# Patient Record
Sex: Male | Born: 1975 | Race: White | Hispanic: No | Marital: Single | Smoking: Current every day smoker
Health system: Southern US, Community
[De-identification: ages and names within clinical notes are randomized; demographics above are authoritative.]

---

## 2008-01-22 ENCOUNTER — Inpatient Hospital Stay (HOSPITAL_COMMUNITY): Admission: EM | Admit: 2008-01-22 | Discharge: 2008-01-24 | Payer: Self-pay | Admitting: Emergency Medicine

## 2008-02-11 ENCOUNTER — Encounter: Admission: RE | Admit: 2008-02-11 | Discharge: 2008-02-11 | Payer: Self-pay | Admitting: Neurosurgery

## 2008-03-02 ENCOUNTER — Encounter: Admission: RE | Admit: 2008-03-02 | Discharge: 2008-03-02 | Payer: Self-pay | Admitting: Neurosurgery

## 2008-04-04 ENCOUNTER — Encounter: Admission: RE | Admit: 2008-04-04 | Discharge: 2008-04-04 | Payer: Self-pay | Admitting: Neurosurgery

## 2008-06-08 ENCOUNTER — Encounter: Admission: RE | Admit: 2008-06-08 | Discharge: 2008-06-08 | Payer: Self-pay | Admitting: Neurosurgery

## 2008-08-24 IMAGING — CR DG THORACOLUMBAR SPINE 2V
2 series · 2 of 2 positions shown · non-contrast
Comparison: [REDACTED] thoracoabdominal CT, 01/22/08.

CLINICAL DATA: Follow-up T12 fracture post fall injury 01/22/08.
 DIAGNOSTIC THORACOLUMBAR SPINE - 2 VIEW:

[t t-spine/l-spine junc. ap]
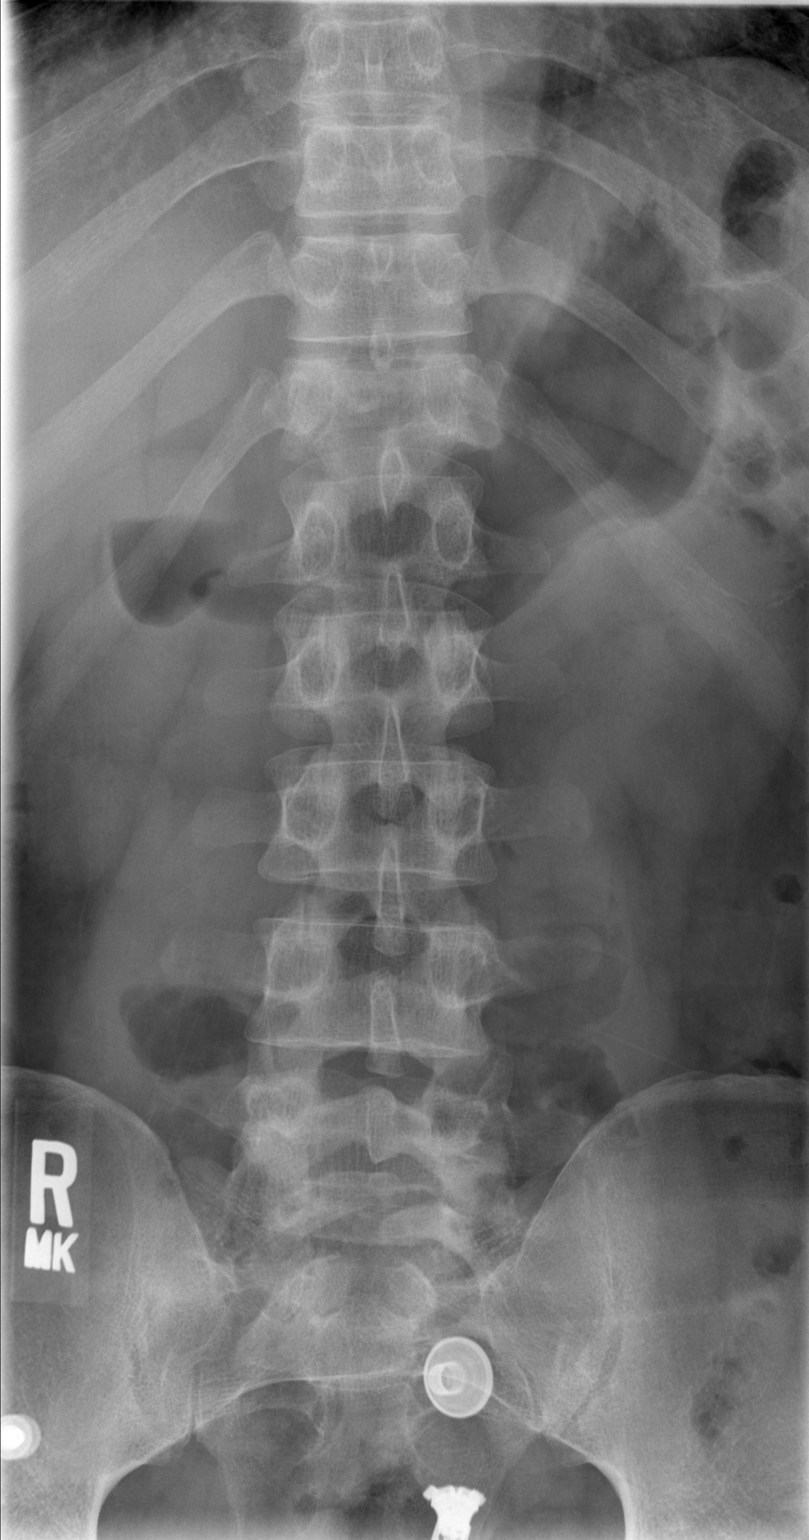

[t t-spine/l-spine junc lat]
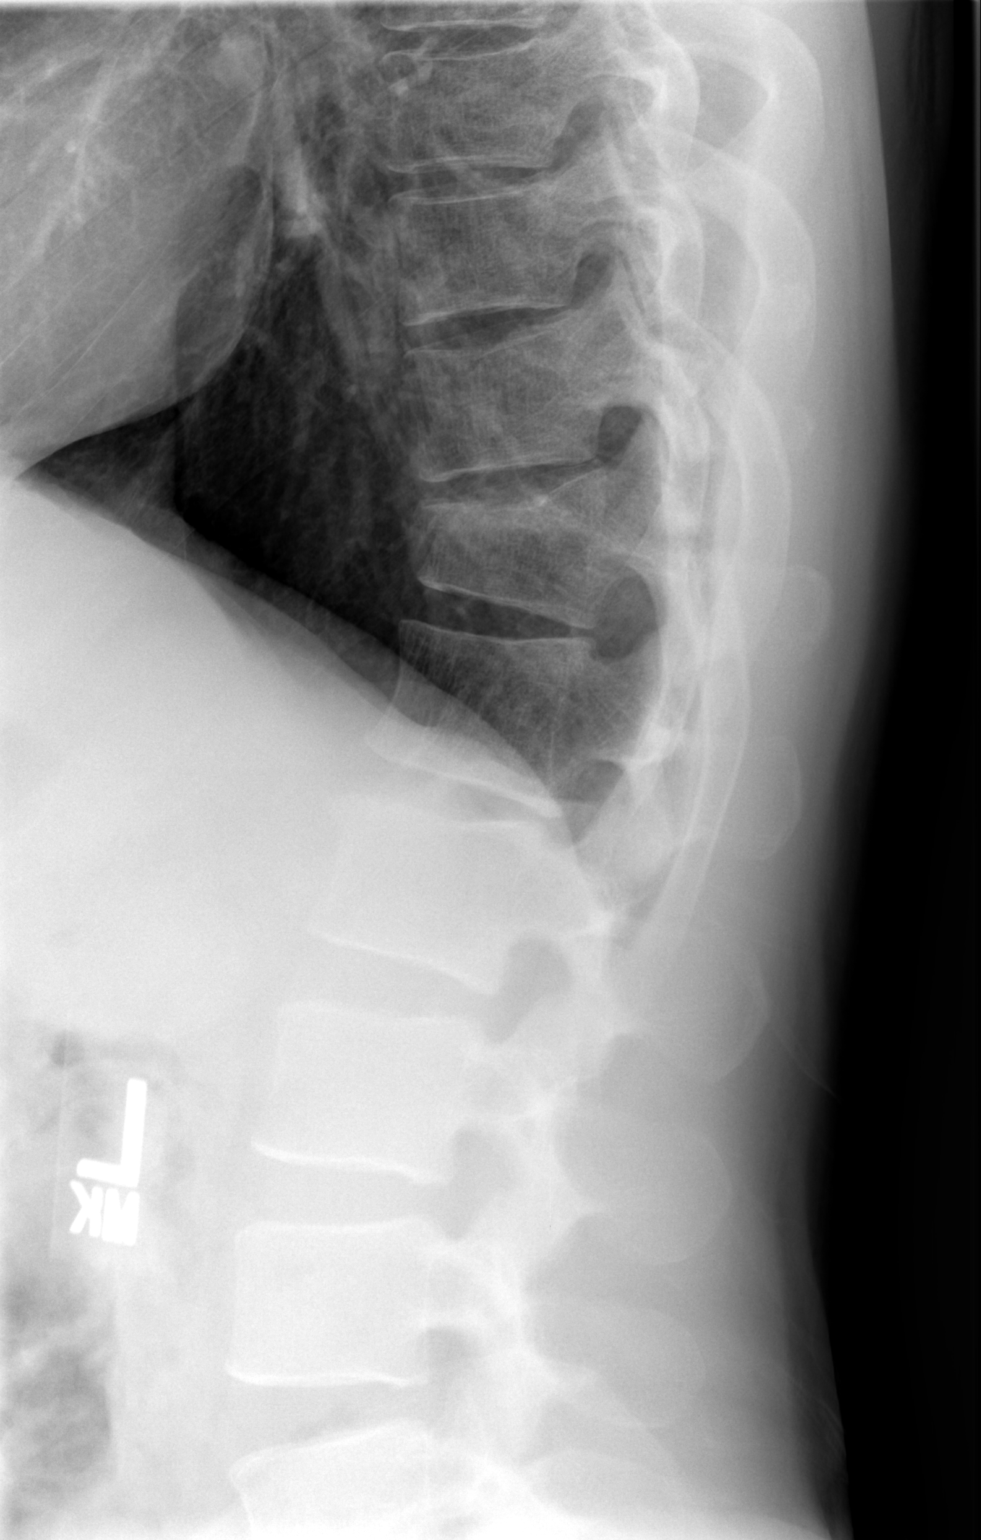

[2 of 2 positions shown; findings below may reference images not displayed]

FINDINGS: Stable slight superior endplate compression fracture with sclerosis consistent with healing is seen at T12 without retropulsion.  No other significant abnormality is seen with disk spaces and posterior vertebral alignment normally maintained (incidental S1 spina bifida occult findings noted).
IMPRESSION: 1.  Healing slight superior T12 compression fracture deformity without retropulsion. 
 2.  Otherwise negative.

## 2011-08-16 LAB — DIFFERENTIAL
Basophils Absolute: 0
Basophils Relative: 0
Eosinophils Absolute: 0.1
Monocytes Absolute: 0.5
Monocytes Relative: 7
Neutrophils Relative %: 76

## 2011-08-16 LAB — CBC
Hemoglobin: 15.3
MCHC: 35
MCV: 97.4
RBC: 4.49
RDW: 12.6

## 2011-08-16 LAB — I-STAT 8, (EC8 V) (CONVERTED LAB)
BUN: 7
Chloride: 106
pCO2, Ven: 42.4 — ABNORMAL LOW
pH, Ven: 7.324 — ABNORMAL HIGH

## 2013-04-12 ENCOUNTER — Ambulatory Visit (INDEPENDENT_AMBULATORY_CARE_PROVIDER_SITE_OTHER): Payer: 59 | Admitting: Licensed Clinical Social Worker

## 2013-04-12 DIAGNOSIS — F1010 Alcohol abuse, uncomplicated: Secondary | ICD-10-CM

## 2013-04-14 ENCOUNTER — Ambulatory Visit: Payer: Self-pay | Admitting: Psychiatry

## 2013-04-15 ENCOUNTER — Ambulatory Visit (INDEPENDENT_AMBULATORY_CARE_PROVIDER_SITE_OTHER): Payer: Self-pay | Admitting: Psychiatry

## 2013-04-15 DIAGNOSIS — F1010 Alcohol abuse, uncomplicated: Secondary | ICD-10-CM

## 2013-04-28 ENCOUNTER — Ambulatory Visit (INDEPENDENT_AMBULATORY_CARE_PROVIDER_SITE_OTHER): Payer: 59 | Admitting: Psychiatry

## 2013-04-28 DIAGNOSIS — F17200 Nicotine dependence, unspecified, uncomplicated: Secondary | ICD-10-CM

## 2013-05-04 ENCOUNTER — Ambulatory Visit (INDEPENDENT_AMBULATORY_CARE_PROVIDER_SITE_OTHER): Payer: 59 | Admitting: Psychiatry

## 2013-05-04 DIAGNOSIS — F1010 Alcohol abuse, uncomplicated: Secondary | ICD-10-CM

## 2013-05-12 ENCOUNTER — Ambulatory Visit (INDEPENDENT_AMBULATORY_CARE_PROVIDER_SITE_OTHER): Payer: 59 | Admitting: Psychiatry

## 2013-05-12 DIAGNOSIS — F1010 Alcohol abuse, uncomplicated: Secondary | ICD-10-CM

## 2013-05-12 DIAGNOSIS — Z7189 Other specified counseling: Secondary | ICD-10-CM

## 2013-05-26 ENCOUNTER — Ambulatory Visit (INDEPENDENT_AMBULATORY_CARE_PROVIDER_SITE_OTHER): Payer: 59 | Admitting: Psychiatry

## 2013-05-26 DIAGNOSIS — F1010 Alcohol abuse, uncomplicated: Secondary | ICD-10-CM

## 2013-05-26 DIAGNOSIS — Z7189 Other specified counseling: Secondary | ICD-10-CM

## 2013-06-09 ENCOUNTER — Ambulatory Visit: Payer: 59 | Admitting: Psychiatry

## 2013-06-10 ENCOUNTER — Ambulatory Visit (INDEPENDENT_AMBULATORY_CARE_PROVIDER_SITE_OTHER): Payer: 59 | Admitting: Psychiatry

## 2013-06-10 DIAGNOSIS — F1010 Alcohol abuse, uncomplicated: Secondary | ICD-10-CM

## 2013-06-24 ENCOUNTER — Ambulatory Visit: Payer: 59 | Admitting: Psychiatry

## 2022-01-30 ENCOUNTER — Other Ambulatory Visit: Payer: Self-pay

## 2022-01-30 ENCOUNTER — Ambulatory Visit: Payer: Self-pay | Admitting: Physician Assistant

## 2022-01-30 VITALS — BP 144/96 | HR 104 | Resp 18 | Ht 69.0 in | Wt 205.0 lb

## 2022-01-30 DIAGNOSIS — R030 Elevated blood-pressure reading, without diagnosis of hypertension: Secondary | ICD-10-CM | POA: Insufficient documentation

## 2022-01-30 DIAGNOSIS — L309 Dermatitis, unspecified: Secondary | ICD-10-CM | POA: Insufficient documentation

## 2022-01-30 MED ORDER — METHYLPREDNISOLONE ACETATE 40 MG/ML IJ SUSP
80.0000 mg | Freq: Once | INTRAMUSCULAR | Status: AC
  Administered 2022-01-30: 80 mg via INTRAMUSCULAR

## 2022-01-30 MED ORDER — TRIAMCINOLONE ACETONIDE 0.5 % EX OINT: 1.0000 "application " | TOPICAL_OINTMENT | Freq: Two times a day (BID) | CUTANEOUS | 1 refills | Status: DC

## 2022-01-30 MED ORDER — HYDROXYZINE PAMOATE 25 MG PO CAPS: 25.0000 mg | ORAL_CAPSULE | Freq: Three times a day (TID) | ORAL | 0 refills | Status: DC | PRN

## 2022-01-30 NOTE — Progress Notes (Signed)
Pt has eaten today. Reports rash and discomfort/pain. ?

## 2022-01-30 NOTE — Patient Instructions (Signed)
You are going to use Kenalog on affected areas twice daily until resolved.  You can use the hydroxyzine 1/2 tablet to 1 full tablet every 8 hours as needed to help you with the itching.  Your blood pressure is elevated today, I do encourage you to check your blood pressure at home, keep a written log and have it available for when you follow-up with the mobile unit in 1 week.  Please return in 1 week, please plan on having fasting labs completed.  I hope that you feel better soon!  Thomas Rad, PA-C Physician Assistant [facility] [email or web address]  Rash, Adult A rash is a change in the color of your skin. A rash can also change the way your skin feels. There are many different conditions and factors that can cause a rash. Some rashes may disappear after a few days, but some may last for a few weeks. Common causes of rashes include: Viral infections, such as: Colds. Measles. Hand, foot, and mouth disease. Bacterial infections, such as: Scarlet fever. Impetigo. Fungal infections, such as Candida. Allergic reactions to food, medicines, or skin care products. Follow these instructions at home: The goal of treatment is to stop the itching and keep the rash from spreading. Pay attention to any changes in your symptoms. Follow these instructions to help with your condition: Medicine Take or apply over-the-counter and prescription medicines only as told by your health care provider. These may include: Corticosteroid creams to treat red or swollen skin. Anti-itch lotions. Oral allergy medicines (antihistamines). Oral corticosteroids for severe symptoms.  Skin care Apply cool compresses to the affected areas. Do not scratch or rub your skin. Avoid covering the rash. Make sure the rash is exposed to air as much as possible. Managing itching and discomfort Avoid hot showers or baths, which can make itching worse. A cold shower may  help. Try taking a bath with: Epsom salts. Follow manufacturer instructions on the packaging. You can get these at your local pharmacy or grocery store. Baking soda. Pour a small amount into the bath as told by your health care provider. Colloidal oatmeal. Follow manufacturer instructions on the packaging. You can get this at your local pharmacy or grocery store. Try applying baking soda paste to your skin. Stir water into baking soda until it reaches a paste-like consistency. Try applying calamine lotion. This is an over-the-counter lotion that helps to relieve itchiness. Keep cool and out of the sun. Sweating and being hot can make itching worse. General instructions  Rest as needed. Drink enough fluid to keep your urine pale yellow. Wear loose-fitting clothing. Avoid scented soaps, detergents, and perfumes. Use gentle soaps, detergents, perfumes, and other cosmetic products. Avoid any substance that causes your rash. Keep a journal to help track what causes your rash. Write down: What you eat. What cosmetic products you use. What you drink. What you wear. This includes jewelry. Keep all follow-up visits as told by your health care provider. This is important. Contact a health care provider if: You sweat at night. You lose weight. You urinate more than normal. You urinate less than normal, or you notice that your urine is a darker color than usual. You feel weak. You vomit. Your skin or the whites of your eyes look yellow (jaundice). Your skin: Tingles. Is numb. Your rash: Does not go away after several days. Gets worse. You are: Unusually thirsty. More tired than normal. You have: New symptoms. Pain in your abdomen. A fever. Diarrhea. Get help  right away if you: Have a fever and your symptoms suddenly get worse. Develop confusion. Have a severe headache or a stiff neck. Have severe joint pains or stiffness. Have a seizure. Develop a rash that covers all or most of  your body. The rash may or may not be painful. Develop blisters that: Are on top of the rash. Grow larger or grow together. Are painful. Are inside your nose or mouth. Develop a rash that: Looks like purple pinprick-sized spots all over your body. Has a "bull's eye" or looks like a target. Is not related to sun exposure, is red and painful, and causes your skin to peel. Summary A rash is a change in the color of your skin. Some rashes disappear after a few days, but some may last for a few weeks. The goal of treatment is to stop the itching and keep the rash from spreading. Take or apply over-the-counter and prescription medicines only as told by your health care provider. Contact a health care provider if you have new or worsening symptoms. Keep all follow-up visits as told by your health care provider. This is important. This information is not intended to replace advice given to you by your health care provider. Make sure you discuss any questions you have with your health care provider. Document Revised: 03/05/2019 Document Reviewed: 06/15/2018 Elsevier Patient Education  2022 [employer].  How to Take Your Blood Pressure Blood pressure is a measurement of how strongly your blood is pressing against the walls of your arteries. Arteries are blood vessels that carry blood from your heart throughout your body. Your health care provider takes your blood pressure at each office visit. You can also take your own blood pressure at home with a blood pressure monitor. You may need to take your own blood pressure to: Confirm a diagnosis of high blood pressure (hypertension). Monitor your blood pressure over time. Make sure your blood pressure medicine is working. Supplies needed: Blood pressure monitor. Dining room chair to sit in. Table or desk. Small notebook and pencil or pen. How to prepare To get the most accurate reading, avoid the following for 30 minutes before you check your  blood pressure: Drinking caffeine. Drinking alcohol. Eating. Smoking. Exercising. Five minutes before you check your blood pressure: Use the bathroom and urinate so that you have an empty bladder. Sit quietly in a dining room chair. Do not sit in a soft couch or an armchair. Do not talk. How to take your blood pressure To check your blood pressure, follow the instructions in the manual that came with your blood pressure monitor. If you have a digital blood pressure monitor, the instructions may be as follows: Sit up straight in a chair. Place your feet on the floor. Do not cross your ankles or legs. Rest your left arm at the level of your heart on a table or desk or on the arm of a chair. Pull up your shirt sleeve. Wrap the blood pressure cuff around the upper part of your left arm, 1 inch (2.5 cm) above your elbow. It is best to wrap the cuff around bare skin. Fit the cuff snugly around your arm. You should be able to place only one finger between the cuff and your arm. Position the cord so that it rests in the bend of your elbow. Press the power button. Sit quietly while the cuff inflates and deflates. Read the digital reading on the monitor screen and write the numbers down (record them) in  a notebook. Wait 2-3 minutes, then repeat the steps, starting at step 1. What does my blood pressure reading mean? A blood pressure reading consists of a higher number over a lower number. Ideally, your blood pressure should be below 120/80. The first ("top") number is called the systolic pressure. It is a measure of the pressure in your arteries as your heart beats. The second ("bottom") number is called the diastolic pressure. It is a measure of the pressure in your arteries as the heart relaxes. Blood pressure is classified into five stages. The following are the stages for adults who do not have a short-term serious illness or a chronic condition. Systolic pressure and diastolic pressure are  measured in a unit called mm Hg (millimeters of mercury).  Normal Systolic pressure: below 123456. Diastolic pressure: below 80. Elevated Systolic pressure: [id number]. Diastolic pressure: below 80. Hypertension stage 1 Systolic pressure: 0000000. Diastolic pressure: [id number]. Hypertension stage 2 Systolic pressure: [id number] or above. Diastolic pressure: 90 or above. You can have elevated blood pressure or hypertension even if only the systolic or only the diastolic number in your reading is higher than normal. Follow these instructions at home: Medicines Take over-the-counter and prescription medicines only as told by your health care provider. Tell your health care provider if you are having any side effects from blood pressure medicine. General instructions Check your blood pressure as often as recommended by your health care provider. Check your blood pressure at the same time every day. Take your monitor to the next appointment with your health care provider to make sure that: You are using it correctly. It provides accurate readings. Understand what your goal blood pressure numbers are. Keep all follow-up visits as told by your health care provider. This is important. General tips Your health care provider can suggest a reliable monitor that will meet your needs. There are several types of home blood pressure monitors. Choose a monitor that has an arm cuff. Do not choose a monitor that measures your blood pressure from your wrist or finger. Choose a cuff that wraps snugly around your upper arm. You should be able to fit only one finger between your arm and the cuff. You can buy a blood pressure monitor at most drugstores or online. Where to find more information American Heart Association: www.heart.org Contact a health care provider if: Your blood pressure is consistently high. Your blood pressure is suddenly low. Get help right away if: Your systolic blood pressure is higher than  180. Your diastolic blood pressure is higher than 120. Summary Blood pressure is a measurement of how strongly your blood is pressing against the walls of your arteries. A blood pressure reading consists of a higher number over a lower number. Ideally, your blood pressure should be below 120/80. Check your blood pressure at the same time every day. Avoid caffeine, alcohol, smoking, and exercise for 30 minutes prior to checking your blood pressure. These agents can affect the accuracy of the blood pressure reading. This information is not intended to replace advice given to you by your health care provider. Make sure you discuss any questions you have with your health care provider. Document Revised: 09/20/2020 Document Reviewed: 11/05/2019 Elsevier Patient Education  2022 [employer].

## 2022-01-30 NOTE — Progress Notes (Signed)
? ?New Patient Office Visit ? ?Subjective:  ?Patient ID: Thomas Whitehead, male    DOB: Apr 24, 1976  Age: 46 y.o. MRN: [id number] ? ?CC:  ?Chief Complaint  ?Patient presents with  ? Rash  ? ? ?HPI ?Thomas Whitehead states that he started having a rash in both of his armpits approximately 2 weeks ago, states since then it has spread down both arms, all to his neck, on his trunk and his back as well as his groin area.  Denies purulence, states that it is itchy, worse at night.  Denies any new detergents, lotions, medications, body washes, soaps.  No one else in the house with similar rash.  States that he has tried hydrocortisone over-the-counter without relief. ? ?States that he does not check his blood pressure at home, states the last time he was seen in a medical office was approximately 5 years ago, states at that time he was told he was "borderline hypertensive".  Denies any hypertensive symptoms. ? ?History reviewed. No pertinent past medical history. ? ?History reviewed. No pertinent surgical history. ? ?History reviewed. No pertinent family history. ? ?Social History  ? ?Socioeconomic History  ? Marital status: Single  ?  Spouse name: Not on file  ? Number of children: Not on file  ? Years of education: Not on file  ? Highest education level: Not on file  ?Occupational History  ? Not on file  ?Tobacco Use  ? Smoking status: Every Day  ?  Types: E-cigarettes  ? Smokeless tobacco: Not on file  ?Substance and Sexual Activity  ? Alcohol use: Not Currently  ? Drug use: Never  ? Sexual activity: Not on file  ?Other Topics Concern  ? Not on file  ?Social History Narrative  ? Not on file  ? ?Social Determinants of Health  ? ?Financial Resource Strain: Not on file  ?Food Insecurity: Not on file  ?Transportation Needs: Not on file  ?Physical Activity: Not on file  ?Stress: Not on file  ?Social Connections: Not on file  ?Intimate Partner Violence: Not on file  ? ? ?ROS ?Review of Systems  ?Constitutional:  Negative for  chills and fever.  ?HENT: Negative.    ?Eyes: Negative.   ?Respiratory:  Negative for shortness of breath.   ?Cardiovascular:  Negative for chest pain.  ?Gastrointestinal: Negative.   ?Endocrine: Negative.   ?Genitourinary: Negative.   ?Musculoskeletal: Negative.   ?Skin:  Positive for rash.  ?Allergic/Immunologic: Negative.   ?Neurological: Negative.   ?Hematological: Negative.   ?Psychiatric/Behavioral: Negative.    ? ?Objective:  ? ?Today's Vitals: BP (!) 144/96 (BP Location: Left Arm, Patient Position: Sitting, Cuff Size: Normal)   Pulse (!) 104   Resp 18   Ht 5\' 9"  (1.753 m)   Wt 205 lb (93 kg)   SpO2 97%   BMI 30.27 kg/m?  ? ?Physical Exam ?Vitals and nursing note reviewed.  ?Constitutional:   ?   Appearance: Normal appearance.  ?HENT:  ?   Head: Normocephalic and atraumatic.  ?   Right Ear: External ear normal.  ?   Left Ear: External ear normal.  ?   Nose: Nose normal.  ?   Mouth/Throat:  ?   Mouth: Mucous membranes are moist.  ?   Pharynx: Oropharynx is clear.  ?Eyes:  ?   Extraocular Movements: Extraocular movements intact.  ?   Conjunctiva/sclera: Conjunctivae normal.  ?   Pupils: Pupils are equal, round, and reactive to light.  ?Cardiovascular:  ?  Rate and Rhythm: Normal rate and regular rhythm.  ?   Pulses: Normal pulses.  ?   Heart sounds: Normal heart sounds.  ?Pulmonary:  ?   Effort: Pulmonary effort is normal.  ?   Breath sounds: Normal breath sounds.  ?Musculoskeletal:     ?   General: Normal range of motion.  ?   Cervical back: Normal range of motion and neck supple.  ?Skin: ?   General: Skin is warm and dry.  ?   Findings: Rash present.  ?   Comments: See photos ?  ?Neurological:  ?   General: No focal deficit present.  ?   Mental Status: He is alert and oriented to person, place, and time.  ?Psychiatric:     ?   Mood and Affect: Mood normal.     ?   Behavior: Behavior normal.     ?   Thought Content: Thought content normal.     ?   Judgment: Judgment normal.   ? ? ? ? ? ? ? ? ? ? ? ?Verbal consent given by patient to have photos included in chart ? ? ?Assessment & Plan:  ? ?Problem List Items Addressed This Visit   ? ?  ? Musculoskeletal and Integument  ? Dermatitis - Primary  ? Relevant Medications  ? triamcinolone ointment (KENALOG) 0.5 %  ? hydrOXYzine (VISTARIL) 25 MG capsule  ?  ? Other  ? Elevated blood pressure reading in office without diagnosis of hypertension  ? ? ?Outpatient Encounter Medications as of 01/30/2022  ?Medication Sig  ? hydrOXYzine (VISTARIL) 25 MG capsule Take 1 capsule (25 mg total) by mouth every 8 (eight) hours as needed.  ? triamcinolone ointment (KENALOG) 0.5 % Apply 1 application. topically 2 (two) times daily.  ? [EXPIRED] methylPREDNISolone acetate (DEPO-MEDROL) injection 80 mg   ? ?No facility-administered encounter medications on file as of 01/30/2022.  ?1. Dermatitis ?Patient given injection of Depo-Medrol in office, trial Kenalog, trial hydroxyzine.  Patient education given on supportive care. ? ? ?- triamcinolone ointment (KENALOG) 0.5 %; Apply 1 application. topically 2 (two) times daily.  Dispense: 60 g; Refill: 1 ?- hydrOXYzine (VISTARIL) 25 MG capsule; Take 1 capsule (25 mg total) by mouth every 8 (eight) hours as needed.  Dispense: 30 capsule; Refill: 0 ?- methylPREDNISolone acetate (DEPO-MEDROL) injection 80 mg ? ?2. Elevated blood pressure reading in office without diagnosis of hypertension ?Patient encouraged to check blood pressure at home on a daily basis.  Patient to return to mobile unit in 1 week for blood pressure review as well as fasting labs. ? ?Patient given application for [facility] financial assistance, patient [facility].  Red flags given for prompt reevaluation ? ? ?I have reviewed the patient's medical history (PMH, PSH, Social History, Family History, Medications, and allergies) , and have been updated if relevant. I spent 30  minutes reviewing chart and  face to face time with patient. ? ? ? ? ?Follow-up: Return in about 1 week (around 02/06/2022).  ? ?[name], PA-C ? ?

## 2022-02-07 ENCOUNTER — Encounter: Payer: Self-pay | Admitting: Physician Assistant

## 2022-02-07 ENCOUNTER — Other Ambulatory Visit: Payer: Self-pay

## 2022-02-07 ENCOUNTER — Ambulatory Visit (INDEPENDENT_AMBULATORY_CARE_PROVIDER_SITE_OTHER): Payer: Self-pay | Admitting: Physician Assistant

## 2022-02-07 DIAGNOSIS — L309 Dermatitis, unspecified: Secondary | ICD-10-CM

## 2022-02-07 DIAGNOSIS — R030 Elevated blood-pressure reading, without diagnosis of hypertension: Secondary | ICD-10-CM

## 2022-02-07 DIAGNOSIS — Z114 Encounter for screening for human immunodeficiency virus [HIV]: Secondary | ICD-10-CM

## 2022-02-07 DIAGNOSIS — Z13220 Encounter for screening for lipoid disorders: Secondary | ICD-10-CM

## 2022-02-07 DIAGNOSIS — Z1159 Encounter for screening for other viral diseases: Secondary | ICD-10-CM

## 2022-02-07 NOTE — Progress Notes (Signed)
? ?Established Patient Office Visit ? ?Subjective:  ?Patient ID: Thomas Whitehead, male    DOB: December 31, 1975  Age: 46 y.o. MRN: [id number] ? ?CC:  ?Chief Complaint  ?Patient presents with  ? Rash  ? ?Virtual Visit via Telephone Note ? ?I connected with Thomas Whitehead on 02/07/22 at  2:00 PM EDT by telephone and verified that I am speaking with the correct person using two identifiers. ? ?Location: ?Patient: Home ?Provider: Home office ?  ?I discussed the limitations, risks, security and privacy concerns of performing an evaluation and management service by telephone and the availability of in person appointments. I also discussed with the patient that there may be a patient responsible charge related to this service. The patient expressed understanding and agreed to proceed. ? ? ?History of Present Illness: ?Thomas Whitehead was seen by the mobile unit on January 30, 2022 with a rash, states that it has improved but is still present.  States that itching is improved.  States that he continues to use the steroid cream twice daily.  States that he did discontinue the deodorant and believes that may have also offered relief. ? ?Reports that he has checked his blood pressure twice since his office visit, states that both times it was within normal limits, states numbers approximately 130/80. ? ?No other concerns at this time ?  ?Observations/Objective: ?Medical history and current medications reviewed, no physical exam completed ? ? ? ?History reviewed. No pertinent past medical history. ? ?History reviewed. No pertinent surgical history. ? ?History reviewed. No pertinent family history. ? ?Social History  ? ?Socioeconomic History  ? Marital status: Single  ?  Spouse name: Not on file  ? Number of children: Not on file  ? Years of education: Not on file  ? Highest education level: Not on file  ?Occupational History  ? Not on file  ?Tobacco Use  ? Smoking status: Every Day  ?  Types: E-cigarettes  ? Smokeless tobacco: Not on  file  ?Substance and Sexual Activity  ? Alcohol use: Not Currently  ? Drug use: Never  ? Sexual activity: Not on file  ?Other Topics Concern  ? Not on file  ?Social History Narrative  ? Not on file  ? ?Social Determinants of Health  ? ?Financial Resource Strain: Not on file  ?Food Insecurity: Not on file  ?Transportation Needs: Not on file  ?Physical Activity: Not on file  ?Stress: Not on file  ?Social Connections: Not on file  ?Intimate Partner Violence: Not on file  ? ? ?Outpatient Medications Prior to Visit  ?Medication Sig Dispense Refill  ? hydrOXYzine (VISTARIL) 25 MG capsule Take 1 capsule (25 mg total) by mouth every 8 (eight) hours as needed. 30 capsule 0  ? triamcinolone ointment (KENALOG) 0.5 % Apply 1 application. topically 2 (two) times daily. 60 g 1  ? ?No facility-administered medications prior to visit.  ? ? ?Not on File ? ?ROS ?Review of Systems  ?Constitutional: Negative.   ?HENT: Negative.    ?Eyes: Negative.   ?Respiratory:  Negative for shortness of breath.   ?Cardiovascular:  Negative for chest pain.  ?Gastrointestinal: Negative.   ?Endocrine: Negative.   ?Genitourinary: Negative.   ?Musculoskeletal: Negative.   ?Skin:  Positive for rash.  ?Allergic/Immunologic: Negative.   ?Neurological: Negative.   ?Hematological: Negative.   ?Psychiatric/Behavioral: Negative.    ? ?  ?Objective:  ?  ? ? ?There were no vitals taken for this visit. ?Wt Readings from Last 3 Encounters:  ?  01/30/22 205 lb (93 kg)  ? ? ? ?Health Maintenance Due  ?Topic Date Due  ? COVID-19 Vaccine (1) Never done  ? Hepatitis C Screening  Never done  ? TETANUS/TDAP  Never done  ? INFLUENZA VACCINE  Never done  ? COLONOSCOPY (Pts 45-37yrs Insurance coverage will need to be confirmed)  Never done  ? ? ?There are no preventive care reminders to display for this patient. ? ?No results found for: TSH ?Lab Results  ?Component Value Date  ? WBC 7.6 01/22/2008  ? HGB 16.0 01/22/2008  ? HCT 47.0 01/22/2008  ? MCV 97.4 01/22/2008  ? PLT 275  01/22/2008  ? ?Lab Results  ?Component Value Date  ? NA 139 01/22/2008  ? K 3.8 01/22/2008  ? GLUCOSE 82 01/22/2008  ? BUN 7 01/22/2008  ? CREATININE 1.0 01/22/2008  ? ?No results found for: CHOL ?No results found for: HDL ?No results found for: [place] ?No results found for: TRIG ?No results found for: CHOLHDL ?No results found for: HGBA1C ? ?  ?Assessment & Plan:  ? ?Problem List Items Addressed This Visit   ? ?  ? Musculoskeletal and Integument  ? Dermatitis - Primary  ?  ? Other  ? Elevated blood pressure reading in office without diagnosis of hypertension  ? Relevant Orders  ? CBC with Differential/Platelet  ? Comp. Metabolic Panel (12)  ? ?Other Visit Diagnoses   ? ? Screening, lipid      ? Relevant Orders  ? Lipid panel  ? Screening for HIV (human immunodeficiency virus)      ? Relevant Orders  ? HIV antibody (with reflex)  ? Encounter for HCV screening test for low risk patient      ? Relevant Orders  ? HCV Ab w Reflex to Quant PCR  ? ?  ? ? ?No orders of the defined types were placed in this encounter. ? ?Assessment and Plan: ?1. Dermatitis ?Continue current regimen. ? ?2. Elevated blood pressure reading in office without diagnosis of hypertension ?Patient to present to clinic at the beginning of the week for fasting labs to be completed.  Patient encouraged to continue checking blood pressure, keeping a written log and having those available for all office visits.  Red flags given for prompt reevaluation. ?- CBC with Differential/Platelet; Future ?- Comp. Metabolic Panel (12); Future ? ?3. Screening, lipid ? ?- Lipid panel; Future ? ?4. Screening for HIV (human immunodeficiency virus) ? ?- HIV antibody (with reflex); Future ? ?5. Encounter for HCV screening test for low risk patient ? ?- HCV Ab w Reflex to Quant PCR; Future ? ? ?Follow Up Instructions: ? ?  ?I discussed the assessment and treatment plan with the patient. The patient was provided an opportunity to ask questions and all were answered. The  patient agreed with the plan and demonstrated an understanding of the instructions. ?  ?The patient was advised to call back or seek an in-person evaluation if the symptoms worsen or if the condition fails to improve as anticipated. ? ?I provided 12 minutes of non-face-to-face time during this encounter. ? ? ?Follow-up: Return in about 4 days (around 02/11/2022) for Fasting  labs.  ? ? ?Taniqua Issa S Mayers, PA-C ?

## 2022-02-07 NOTE — Patient Instructions (Signed)
I encourage you to continue using the steroid cream as prescribed.  I encourage you to continue checking your blood pressure on a daily basis, keep a written log and have that available for all office visits. ? ?Please let us know if you are going to come in on Monday or Tuesday morning to have your fasting labs completed. ? ?Roney Jaffe, PA-C ?Physician Assistant ?[facility] ?[email or web address] ? ? ?Health Maintenance, Male ?Adopting a healthy lifestyle and getting preventive care are important in promoting health and wellness. Ask your health care provider about: ?The right schedule for you to have regular tests and exams. ?Things you can do on your own to prevent diseases and keep yourself healthy. ?What should I know about diet, weight, and exercise? ?Eat a healthy diet ? ?Eat a diet that includes plenty of vegetables, fruits, low-fat dairy products, and lean protein. ?Do not eat a lot of foods that are high in solid fats, added sugars, or sodium. ?Maintain a healthy weight ?Body mass index (BMI) is a measurement that can be used to identify possible weight problems. It estimates body fat based on height and weight. Your health care provider can help determine your BMI and help you achieve or maintain a healthy weight. ?Get regular exercise ?Get regular exercise. This is one of the most important things you can do for your health. Most adults should: ?Exercise for at least 150 minutes each week. The exercise should increase your heart rate and make you sweat (moderate-intensity exercise). ?Do strengthening exercises at least twice a week. This is in addition to the moderate-intensity exercise. ?Spend less time sitting. Even light physical activity can be beneficial. ?Watch cholesterol and blood lipids ?Have your blood tested for lipids and cholesterol at 46 years of age, then have this test every 5 years. ?You may need to have your cholesterol levels  checked more often if: ?Your lipid or cholesterol levels are high. ?You are older than 46 years of age. ?You are at high risk for heart disease. ?What should I know about cancer screening? ?Many types of cancers can be detected early and may often be prevented. Depending on your health history and family history, you may need to have cancer screening at various ages. This may include screening for: ?Colorectal cancer. ?Prostate cancer. ?Skin cancer. ?Lung cancer. ?What should I know about heart disease, diabetes, and high blood pressure? ?Blood pressure and heart disease ?High blood pressure causes heart disease and increases the risk of stroke. This is more likely to develop in people who have high blood pressure readings or are overweight. ?Talk with your health care provider about your target blood pressure readings. ?Have your blood pressure checked: ?Every 3-5 years if you are 60-78 years of age. ?Every year if you are 61 years old or older. ?If you are between the ages of 56 and 72 and are a current or former smoker, ask your health care provider if you should have a one-time screening for abdominal aortic aneurysm (AAA). ?Diabetes ?Have regular diabetes screenings. This checks your fasting blood sugar level. Have the screening done: ?Once every three years after age 42 if you are at a normal weight and have a low risk for diabetes. ?More often and at a younger age if you are overweight or have a high risk for diabetes. ?What should I know about preventing infection? ?Hepatitis B ?If you have a higher risk for hepatitis B, you should be screened for this virus. Talk with your health  care provider to find out if you are at risk for hepatitis B infection. ?Hepatitis C ?Blood testing is recommended for: ?Everyone born from [birth year] through 1965. ?Anyone with known risk factors for hepatitis C. ?Sexually transmitted infections (STIs) ?You should be screened each year for STIs, including gonorrhea and chlamydia,  if: ?You are sexually active and are younger than 46 years of age. ?You are older than 46 years of age and your health care provider tells you that you are at risk for this type of infection. ?Your sexual activity has changed since you were last screened, and you are at increased risk for chlamydia or gonorrhea. Ask your health care provider if you are at risk. ?Ask your health care provider about whether you are at high risk for HIV. Your health care provider may recommend a prescription medicine to help prevent HIV infection. If you choose to take medicine to prevent HIV, you should first get tested for HIV. You should then be tested every 3 months for as long as you are taking the medicine. ?Follow these instructions at home: ?Alcohol use ?Do not drink alcohol if your health care provider tells you not to drink. ?If you drink alcohol: ?Limit how much you have to 0-2 drinks a day. ?Know how much alcohol is in your drink. In the [place] one drink equals one 12 oz bottle of beer (355 mL), one 5 oz glass of wine (148 mL), or one 1? oz glass of hard liquor (44 mL). ?Lifestyle ?Do not use any products that contain nicotine or tobacco. These products include cigarettes, chewing tobacco, and vaping devices, such as e-cigarettes. If you need help quitting, ask your health care provider. ?Do not use street drugs. ?Do not share needles. ?Ask your health care provider for help if you need support or information about quitting drugs. ?General instructions ?Schedule regular health, dental, and eye exams. ?Stay current with your vaccines. ?Tell your health care provider if: ?You often feel depressed. ?You have ever been abused or do not feel safe at home. ?Summary ?Adopting a healthy lifestyle and getting preventive care are important in promoting health and wellness. ?Follow your health care provider's instructions about healthy diet, exercising, and getting tested or screened for diseases. ?Follow your health care provider's  instructions on monitoring your cholesterol and blood pressure. ?This information is not intended to replace advice given to you by your health care provider. Make sure you discuss any questions you have with your health care provider. ?Document Revised: 04/02/2021 Document Reviewed: 04/02/2021 ?Elsevier Patient Education ? 2022 Elsevier Inc. ? ?

## 2022-02-08 ENCOUNTER — Encounter: Payer: Self-pay | Admitting: Physician Assistant

## 2022-02-15 ENCOUNTER — Ambulatory Visit: Payer: Self-pay | Attending: Physician Assistant

## 2022-02-15 ENCOUNTER — Other Ambulatory Visit: Payer: Self-pay

## 2022-02-15 DIAGNOSIS — R030 Elevated blood-pressure reading, without diagnosis of hypertension: Secondary | ICD-10-CM

## 2022-02-15 DIAGNOSIS — Z13220 Encounter for screening for lipoid disorders: Secondary | ICD-10-CM

## 2022-02-15 DIAGNOSIS — Z114 Encounter for screening for human immunodeficiency virus [HIV]: Secondary | ICD-10-CM

## 2022-02-15 DIAGNOSIS — Z1159 Encounter for screening for other viral diseases: Secondary | ICD-10-CM

## 2022-02-15 DIAGNOSIS — E782 Mixed hyperlipidemia: Secondary | ICD-10-CM

## 2022-02-16 LAB — COMP. METABOLIC PANEL (12)
AST: 24 IU/L (ref 0–40)
Albumin/Globulin Ratio: 1.6 (ref 1.2–2.2)
Albumin: 4.6 g/dL (ref 4.0–5.0)
Alkaline Phosphatase: 70 IU/L (ref 44–121)
BUN/Creatinine Ratio: 17 (ref 9–20)
BUN: 14 mg/dL (ref 6–24)
Bilirubin Total: 0.2 mg/dL (ref 0.0–1.2)
Calcium: 9.7 mg/dL (ref 8.7–10.2)
Chloride: 98 mmol/L (ref 96–106)
Creatinine, Ser: 0.81 mg/dL (ref 0.76–1.27)
Globulin, Total: 2.9 g/dL (ref 1.5–4.5)
Glucose: 98 mg/dL (ref 70–99)
Potassium: 4 mmol/L (ref 3.5–5.2)
Sodium: 136 mmol/L (ref 134–144)
Total Protein: 7.5 g/dL (ref 6.0–8.5)
eGFR: 111 mL/min/{1.73_m2} (ref >59)

## 2022-02-16 LAB — CBC WITH DIFFERENTIAL/PLATELET
Basophils Absolute: 0 10*3/uL (ref 0.0–0.2)
Basos: 1 %
EOS (ABSOLUTE): 0.2 10*3/uL (ref 0.0–0.4)
Eos: 3 %
Hematocrit: 42.2 % (ref 37.5–51.0)
Hemoglobin: 14.3 g/dL (ref 13.0–17.7)
Immature Grans (Abs): 0 10*3/uL (ref 0.0–0.1)
Immature Granulocytes: 0 %
Lymphocytes Absolute: 1.3 10*3/uL (ref 0.7–3.1)
Lymphs: 21 %
MCH: 32.9 pg (ref 26.6–33.0)
MCHC: 33.9 g/dL (ref 31.5–35.7)
MCV: 97 fL (ref 79–97)
Monocytes Absolute: 0.6 10*3/uL (ref 0.1–0.9)
Monocytes: 10 %
Neutrophils Absolute: 4 10*3/uL (ref 1.4–7.0)
Neutrophils: 65 %
Platelets: 267 10*3/uL (ref 150–450)
RBC: 4.34 x10E6/uL (ref 4.14–5.80)
RDW: 12.3 % (ref 11.6–15.4)
WBC: 6.1 10*3/uL (ref 3.4–10.8)

## 2022-02-16 LAB — LIPID PANEL
Chol/HDL Ratio: 4.7 ratio (ref 0.0–5.0)
Cholesterol, Total: 242 mg/dL — ABNORMAL HIGH (ref 100–199)
HDL: 51 mg/dL (ref >39)
LDL Chol Calc (NIH): 130 mg/dL — ABNORMAL HIGH (ref 0–99)
Triglycerides: 341 mg/dL — ABNORMAL HIGH (ref 0–149)
VLDL Cholesterol Cal: 61 mg/dL — ABNORMAL HIGH (ref 5–40)

## 2022-02-16 LAB — HCV AB W REFLEX TO QUANT PCR: HCV Ab: NONREACTIVE

## 2022-02-16 LAB — HCV INTERPRETATION

## 2022-02-16 LAB — HIV ANTIBODY (ROUTINE TESTING W REFLEX): HIV Screen 4th Generation wRfx: NONREACTIVE

## 2022-02-20 ENCOUNTER — Telehealth: Payer: Self-pay | Admitting: *Deleted

## 2022-02-20 ENCOUNTER — Encounter: Payer: Self-pay | Admitting: *Deleted

## 2022-02-20 MED ORDER — ATORVASTATIN CALCIUM 10 MG PO TABS: 10.0000 mg | ORAL_TABLET | Freq: Every day | ORAL | 1 refills | Status: DC

## 2022-02-20 NOTE — Addendum Note (Signed)
Addended by: Roney Jaffe on: 02/20/2022 11:05 AM ? ? Modules accepted: Orders ? ?

## 2022-02-20 NOTE — Telephone Encounter (Signed)
-----   Message from Roney Jaffe, [place] sent at 02/20/2022 11:05 AM EDT ----- ?Please call patient and let him know that his kidney function and liver function are within normal limits.  He does not show signs of anemia.  His screening for hepatitis C and HIV were negative.  His cholesterol is very elevated, his risk of a cardiovascular event in the next 10 years is 8.9%.  It is recommended that he start cholesterol medication at this time and have these levels rechecked in 3 months.  Prescription sent to his pharmacy. ? ?The 10-year ASCVD risk score (Arnett DK, et al., 2019) is: 8.9% ?  Values used to calculate the score: ?    Age: 46 years ?    Sex: Male ?    Is Non-Hispanic African American: No ?    Diabetic: No ?    Tobacco smoker: Yes ?    Systolic Blood Pressure: 144 mmHg ?    Is BP treated: No ?    HDL Cholesterol: 51 mg/dL ?    Total Cholesterol: 242 mg/dL ? ?

## 2022-02-20 NOTE — Telephone Encounter (Signed)
MA UTR patient x2 or LVM due to option being full. Communication letter will be mailed to the address on file. ?

## 2022-05-21 ENCOUNTER — Ambulatory Visit: Payer: Self-pay | Attending: Internal Medicine | Admitting: Internal Medicine

## 2022-05-21 ENCOUNTER — Other Ambulatory Visit: Payer: Self-pay

## 2022-05-21 ENCOUNTER — Encounter: Payer: Self-pay | Admitting: Internal Medicine

## 2022-05-21 VITALS — BP 140/90 | HR 96 | Temp 98.4°F | Resp 16 | Ht 69.0 in | Wt 193.0 lb

## 2022-05-21 DIAGNOSIS — I10 Essential (primary) hypertension: Secondary | ICD-10-CM

## 2022-05-21 DIAGNOSIS — F1010 Alcohol abuse, uncomplicated: Secondary | ICD-10-CM

## 2022-05-21 DIAGNOSIS — Z720 Tobacco use: Secondary | ICD-10-CM

## 2022-05-21 DIAGNOSIS — Z7689 Persons encountering health services in other specified circumstances: Secondary | ICD-10-CM

## 2022-05-21 DIAGNOSIS — Z1211 Encounter for screening for malignant neoplasm of colon: Secondary | ICD-10-CM

## 2022-05-21 DIAGNOSIS — E782 Mixed hyperlipidemia: Secondary | ICD-10-CM

## 2022-05-21 DIAGNOSIS — L309 Dermatitis, unspecified: Secondary | ICD-10-CM

## 2022-05-21 MED ORDER — AMLODIPINE BESYLATE 5 MG PO TABS
5.0000 mg | ORAL_TABLET | Freq: Every day | ORAL | 5 refills | Status: DC
  Filled 2022-05-21: qty 30, 30d supply, fill #0

## 2022-05-23 ENCOUNTER — Other Ambulatory Visit: Payer: Self-pay

## 2022-05-27 ENCOUNTER — Other Ambulatory Visit: Payer: Self-pay

## 2022-05-30 ENCOUNTER — Other Ambulatory Visit: Payer: Self-pay

## 2022-06-27 ENCOUNTER — Ambulatory Visit: Payer: Self-pay | Admitting: Pharmacist

## 2022-08-22 ENCOUNTER — Encounter: Payer: Self-pay | Admitting: Internal Medicine

## 2022-08-22 ENCOUNTER — Ambulatory Visit: Payer: Self-pay | Attending: Internal Medicine | Admitting: Internal Medicine

## 2022-08-22 VITALS — BP 147/100 | HR 84 | Ht 69.0 in | Wt 195.6 lb

## 2022-08-22 DIAGNOSIS — F1090 Alcohol use, unspecified, uncomplicated: Secondary | ICD-10-CM

## 2022-08-22 DIAGNOSIS — E782 Mixed hyperlipidemia: Secondary | ICD-10-CM

## 2022-08-22 DIAGNOSIS — I10 Essential (primary) hypertension: Secondary | ICD-10-CM

## 2022-08-22 DIAGNOSIS — E663 Overweight: Secondary | ICD-10-CM

## 2022-08-22 DIAGNOSIS — U070 Vaping-related disorder: Secondary | ICD-10-CM

## 2022-08-22 DIAGNOSIS — Z23 Encounter for immunization: Secondary | ICD-10-CM

## 2022-08-22 DIAGNOSIS — Z720 Tobacco use: Secondary | ICD-10-CM | POA: Insufficient documentation

## 2022-08-22 DIAGNOSIS — Z6828 Body mass index (BMI) 28.0-28.9, adult: Secondary | ICD-10-CM

## 2022-08-22 DIAGNOSIS — S20222A Contusion of left back wall of thorax, initial encounter: Secondary | ICD-10-CM

## 2022-08-22 DIAGNOSIS — Z2821 Immunization not carried out because of patient refusal: Secondary | ICD-10-CM

## 2022-08-22 MED ORDER — AMLODIPINE BESYLATE 10 MG PO TABS: 10.0000 mg | ORAL_TABLET | Freq: Every day | ORAL | 6 refills | Status: DC

## 2022-08-22 NOTE — Patient Instructions (Signed)
I recommend using IcyHot rub to your back.  Use of a heating pad will also help.  Healthy Eating Following a healthy eating pattern may help you to achieve and maintain a healthy body weight, reduce the risk of chronic disease, and live a long and productive life. It is important to follow a healthy eating pattern at an appropriate calorie level for your body. Your nutritional needs should be met primarily through food by choosing a variety of nutrient-rich foods. What are tips for following this plan? Reading food labels Read labels and choose the following: Reduced or low sodium. Juices with 100% fruit juice. Foods with low saturated fats and high polyunsaturated and monounsaturated fats. Foods with whole grains, such as whole wheat, cracked wheat, brown rice, and wild rice. Whole grains that are fortified with folic acid. This is recommended for women who are pregnant or who want to become pregnant. Read labels and avoid the following: Foods with a lot of added sugars. These include foods that contain brown sugar, corn sweetener, corn syrup, dextrose, fructose, glucose, high-fructose corn syrup, honey, invert sugar, lactose, malt syrup, maltose, molasses, raw sugar, sucrose, trehalose, or turbinado sugar. Do not eat more than the following amounts of added sugar per day: 6 teaspoons (25 g) for women. 9 teaspoons (38 g) for men. Foods that contain processed or refined starches and grains. Refined grain products, such as white flour, degermed cornmeal, white bread, and white rice. Shopping Choose nutrient-rich snacks, such as vegetables, whole fruits, and nuts. Avoid high-calorie and high-sugar snacks, such as potato chips, fruit snacks, and candy. Use oil-based dressings and spreads on foods instead of solid fats such as butter, stick margarine, or cream cheese. Limit pre-made sauces, mixes, and "instant" products such as flavored rice, instant noodles, and ready-made pasta. Try more  plant-protein sources, such as tofu, tempeh, black beans, edamame, lentils, nuts, and seeds. Explore eating plans such as the Mediterranean diet or vegetarian diet. Cooking Use oil to saut or stir-fry foods instead of solid fats such as butter, stick margarine, or lard. Try baking, boiling, grilling, or broiling instead of frying. Remove the fatty part of meats before cooking. Steam vegetables in water or broth. Meal planning  At meals, imagine dividing your plate into fourths: One-half of your plate is fruits and vegetables. One-fourth of your plate is whole grains. One-fourth of your plate is protein, especially lean meats, poultry, eggs, tofu, beans, or nuts. Include low-fat dairy as part of your daily diet. Lifestyle Choose healthy options in all settings, including home, work, school, restaurants, or stores. Prepare your food safely: Wash your hands after handling raw meats. Keep food preparation surfaces clean by regularly washing with hot, soapy water. Keep raw meats separate from ready-to-eat foods, such as fruits and vegetables. Cook seafood, meat, poultry, and eggs to the recommended internal temperature. Store foods at safe temperatures. In general: Keep cold foods at 65F (4.4C) or below. Keep hot foods at 165F (60C) or above. Keep your freezer at [facility] (-17.8C) or below. Foods are no longer safe to eat when they have been between the temperatures of 40-165F (4.4-60C) for more than 2 hours. What foods should I eat? Fruits Aim to eat 2 cup-equivalents of fresh, canned (in natural juice), or frozen fruits each day. Examples of 1 cup-equivalent of fruit include 1 small apple, 8 large strawberries, 1 cup canned fruit,  cup dried fruit, or 1 cup 100% juice. Vegetables Aim to eat 2-3 cup-equivalents of fresh and frozen vegetables each day,  including different varieties and colors. Examples of 1 cup-equivalent of vegetables include 2 medium carrots, 2 cups raw, leafy  greens, 1 cup chopped vegetable (raw or cooked), or 1 medium baked potato. Grains Aim to eat 6 ounce-equivalents of whole grains each day. Examples of 1 ounce-equivalent of grains include 1 slice of bread, 1 cup ready-to-eat cereal, 3 cups popcorn, or  cup cooked rice, pasta, or cereal. Meats and other proteins Aim to eat 5-6 ounce-equivalents of protein each day. Examples of 1 ounce-equivalent of protein include 1 egg, 1/2 cup nuts or seeds, or 1 tablespoon (16 g) peanut butter. A cut of meat or fish that is the size of a deck of cards is about 3-4 ounce-equivalents. Of the protein you eat each week, try to have at least 8 ounces come from seafood. This includes salmon, trout, herring, and anchovies. Dairy Aim to eat 3 cup-equivalents of fat-free or low-fat dairy each day. Examples of 1 cup-equivalent of dairy include 1 cup (240 mL) milk, 8 ounces (250 g) yogurt, 1 ounces (44 g) natural cheese, or 1 cup (240 mL) fortified soy milk. Fats and oils Aim for about 5 teaspoons (21 g) per day. Choose monounsaturated fats, such as canola and olive oils, avocados, peanut butter, and most nuts, or polyunsaturated fats, such as sunflower, corn, and soybean oils, walnuts, pine nuts, sesame seeds, sunflower seeds, and flaxseed. Beverages Aim for six 8-oz glasses of water per day. Limit coffee to three to five 8-oz cups per day. Limit caffeinated beverages that have added calories, such as soda and energy drinks. Limit alcohol intake to no more than 1 drink a day for nonpregnant women and 2 drinks a day for men. One drink equals 12 oz of beer (355 mL), 5 oz of wine (148 mL), or 1 oz of hard liquor (44 mL). Seasoning and other foods Avoid adding excess amounts of salt to your foods. Try flavoring foods with herbs and spices instead of salt. Avoid adding sugar to foods. Try using oil-based dressings, sauces, and spreads instead of solid fats. This information is based on general [place] nutrition guidelines. For  more information, visit [employer]. Exact amounts may vary based on your nutrition needs. Summary A healthy eating plan may help you to maintain a healthy weight, reduce the risk of chronic diseases, and stay active throughout your life. Plan your meals. Make sure you eat the right portions of a variety of nutrient-rich foods. Try baking, boiling, grilling, or broiling instead of frying. Choose healthy options in all settings, including home, work, school, restaurants, or stores. This information is not intended to replace advice given to you by your health care provider. Make sure you discuss any questions you have with your health care provider. Document Revised: 07/10/2021 Document Reviewed: 07/10/2021 Elsevier Patient Education  [place].

## 2022-08-22 NOTE — Progress Notes (Signed)
Patient ID: Thomas Whitehead, male    DOB: [birth year]  MRN: [id number]  CC: Hypertension   Subjective: Thomas Whitehead is a 46 y.o. male who presents for chronic ds management His concerns today include:  Pt with hx of HTN, HL, tob dep/vapes, EtOH use disorder,  HTN; started on Norvasc 5 mg on last visit 3 mths ago -did get med but was not taking consistently; only tahking about 1-2x/wk.  However, since his work schedule change earlier this mth he is taking more consistently.  May miss 1-2 days.   -took Norvasc 5 mg already this a.m Tries to limit sodium intake.   HL:  on last visit we recommended restarting Lipitor taking in evenings.  Pt admits that he has not been consistent with taking this either.   Nicotine Vape:  has dec some on this.  1 device last 2 wks now instead of 1 wk.    ETOH use:  drinks a 6 pk beer 5 days/wk.    HM:  turned in FIT kit today.  Declines flu shot.  Had 2 COVID vaccines.   Patient Active Problem List   Diagnosis Date Noted   Vapes nicotine containing substance 08/22/2022   Alcohol use disorder 08/22/2022   Dermatitis 01/30/2022   Elevated blood pressure reading in office without diagnosis of hypertension 01/30/2022     Current Outpatient Medications on File Prior to Visit  Medication Sig Dispense Refill   atorvastatin (LIPITOR) 10 MG tablet Take 1 tablet (10 mg total) by mouth daily. (Patient not taking: Reported on 05/21/2022) 90 tablet 1   No current facility-administered medications on file prior to visit.    Not on File  Social History   Socioeconomic History   Marital status: Single    Spouse name: Not on file   Number of children: Not on file   Years of education: Not on file   Highest education level: Not on file  Occupational History   Not on file  Tobacco Use   Smoking status: Every Day    Types: E-cigarettes   Smokeless tobacco: Not on file  Substance and Sexual Activity   Alcohol use: Not Currently   Drug use: Never    Sexual activity: Not on file  Other Topics Concern   Not on file  Social History Narrative   Not on file   Social Determinants of Health   Financial Resource Strain: Not on file  Food Insecurity: Not on file  Transportation Needs: Not on file  Physical Activity: Not on file  Stress: Not on file  Social Connections: Not on file  Intimate Partner Violence: Not on file    No family history on file.  No past surgical history on file.  ROS: Review of Systems General: Just started the gym last week.  He is gone 3 times already.  He has cut back on eating red meat.  MSK: Slipped on a leaf while taking his trash out 6 days ago.  Landed on his left side.  Left flank has been sore since then but is getting better.  He has been massaging it and using some over-the-counter rub.  PHYSICAL EXAM: BP (!) 147/100   Pulse 84   Ht 5\' 9"  (1.753 m)   Wt 195 lb 9.6 oz (88.7 kg)   SpO2 100%   BMI 28.89 kg/m   Physical Exam BP 150/100 General appearance - alert, well appearing, and in no distress Mental status - normal mood, behavior, speech, dress,  motor activity, and thought processes Neck - supple, no significant adenopathy Chest - clear to auscultation, no wheezes, rales or rhonchi, symmetric air entry Heart - normal rate, regular rhythm, normal S1, S2, no murmurs, rubs, clicks or gallops Musculoskeletal -left flank: No edema or erythema.  No ecchymosis.  Mild tenderness on palpation of the flank.  No tenderness on palpation of the thoracic spine. Extremities - peripheral pulses normal, no pedal edema, no clubbing or cyanosis     Latest Ref Rng & Units 02/15/2022    8:36 AM 01/22/2008    4:36 AM  CMP  Glucose 70 - 99 mg/dL 98  82   BUN 6 - 24 mg/dL 14  7   Creatinine 0.76 - 1.27 mg/dL 0.81  1.0   Sodium 134 - 144 mmol/L 136  139   Potassium 3.5 - 5.2 mmol/L 4.0  3.8   Chloride 96 - 106 mmol/L 98  106   Calcium 8.7 - 10.2 mg/dL 9.7    Total Protein 6.0 - 8.5 g/dL 7.5    Total  Bilirubin 0.0 - 1.2 mg/dL 0.2    Alkaline Phos 44 - 121 IU/L 70    AST 0 - 40 IU/L 24     Lipid Panel     Component Value Date/Time   CHOL 242 (H) 02/15/2022 0836   TRIG 341 (H) 02/15/2022 0836   HDL 51 02/15/2022 0836   CHOLHDL 4.7 02/15/2022 0836   LDLCALC 130 (H) 02/15/2022 0836    CBC    Component Value Date/Time   WBC 6.1 02/15/2022 0836   WBC 7.6 01/22/2008 0430   RBC 4.34 02/15/2022 0836   RBC 4.49 01/22/2008 0430   HGB 14.3 02/15/2022 0836   HCT 42.2 02/15/2022 0836   PLT 267 02/15/2022 0836   MCV 97 02/15/2022 0836   MCH 32.9 02/15/2022 0836   MCHC 33.9 02/15/2022 0836   MCHC 35.0 01/22/2008 0430   RDW 12.3 02/15/2022 0836   LYMPHSABS 1.3 02/15/2022 0836   MONOABS 0.5 01/22/2008 0430   EOSABS 0.2 02/15/2022 0836   BASOSABS 0.0 02/15/2022 0836    ASSESSMENT AND PLAN: 1. Essential hypertension Not at goal.  Discussed health risks associated with uncontrolled blood pressure.  Encourage compliance.  Encouraged him to get a med box.  Increase amlodipine to 10 mg daily.  Follow-up with clinical pharmacist in 1 month for recheck. - amLODipine (NORVASC) 10 MG tablet; Take 1 tablet (10 mg total) by mouth daily.  Dispense: 30 tablet; Refill: 6  2. Alcohol use disorder Encouraged him again to cut back to no more than  two 12 oz beers a day Discussed health risks associated with persistent heavy alcohol use.  3. Vapes nicotine containing substance Continue to encourage him to cut back and quit.  4. Mixed hyperlipidemia Patient reports that now that his work schedule has changed for the better, he will be more consistent with taking both his blood pressure medicine and the atorvastatin.  Encouraged use of the med box.  5. Overweight (BMI 25.0-29.9) Patient advised to eliminate sugary drinks from the diet, cut back on portion sizes especially of white carbohydrates, eat more white lean meat like chicken [place] and seafood instead of beef or pork and incorporate fresh  fruits and vegetables into the diet daily. Commended him for joining a gym.  Goal is to get in about 30 minutes of moderate intensity exercise 5 days a week.  6. Contusion of left side of back, initial encounter Recommend use of  IcyHot rub and heating pad.  7. Influenza vaccination declined Recommended.  Patient declined.  8. Need for first booster dose of COVID-19 vaccine Encouraged him to get COVID booster this fall from any outside pharmacy.    Patient was given the opportunity to ask questions.  Patient verbalized understanding of the plan and was able to repeat key elements of the plan.   This documentation was completed using Radio producer.  Any transcriptional errors are unintentional.  No orders of the defined types were placed in this encounter.    Requested Prescriptions   Signed Prescriptions Disp Refills   amLODipine (NORVASC) 10 MG tablet 30 tablet 6    Sig: Take 1 tablet (10 mg total) by mouth daily.    Return in about 4 months (around 12/22/2022) for Appt with [facility] in 4 wks for BP check.  Karle Plumber, MD, FACP

## 2022-08-23 LAB — FECAL OCCULT BLOOD, IMMUNOCHEMICAL: Fecal Occult Bld: NEGATIVE

## 2022-09-23 ENCOUNTER — Ambulatory Visit: Payer: Self-pay | Admitting: Pharmacist

## 2022-10-28 ENCOUNTER — Ambulatory Visit: Payer: Self-pay | Admitting: Pharmacist

## 2022-12-23 ENCOUNTER — Ambulatory Visit: Payer: Self-pay | Admitting: Internal Medicine

## 2023-03-21 ENCOUNTER — Ambulatory Visit: Payer: BC Managed Care – PPO | Attending: Internal Medicine | Admitting: Internal Medicine

## 2023-03-21 ENCOUNTER — Other Ambulatory Visit: Payer: Self-pay

## 2023-03-21 ENCOUNTER — Encounter: Payer: Self-pay | Admitting: Internal Medicine

## 2023-03-21 VITALS — BP 143/91 | HR 100 | Temp 98.5°F | Ht 69.0 in | Wt 200.0 lb

## 2023-03-21 DIAGNOSIS — E782 Mixed hyperlipidemia: Secondary | ICD-10-CM

## 2023-03-21 DIAGNOSIS — L309 Dermatitis, unspecified: Secondary | ICD-10-CM

## 2023-03-21 DIAGNOSIS — F1090 Alcohol use, unspecified, uncomplicated: Secondary | ICD-10-CM

## 2023-03-21 DIAGNOSIS — I10 Essential (primary) hypertension: Secondary | ICD-10-CM | POA: Diagnosis not present

## 2023-03-21 DIAGNOSIS — U070 Vaping-related disorder: Secondary | ICD-10-CM

## 2023-03-21 DIAGNOSIS — Z23 Encounter for immunization: Secondary | ICD-10-CM | POA: Diagnosis not present

## 2023-03-21 DIAGNOSIS — Z6829 Body mass index (BMI) 29.0-29.9, adult: Secondary | ICD-10-CM

## 2023-03-21 DIAGNOSIS — E663 Overweight: Secondary | ICD-10-CM

## 2023-03-21 DIAGNOSIS — Z720 Tobacco use: Secondary | ICD-10-CM

## 2023-03-21 MED ORDER — AMLODIPINE BESYLATE 10 MG PO TABS
10.0000 mg | ORAL_TABLET | Freq: Every day | ORAL | 6 refills | Status: AC
Start: 2023-03-21 — End: ?

## 2023-03-21 MED ORDER — PREDNISONE 20 MG PO TABS
20.0000 mg | ORAL_TABLET | Freq: Every day | ORAL | 0 refills | Status: AC
Start: 2023-03-21 — End: ?

## 2023-03-21 MED ORDER — HYDROXYZINE PAMOATE 25 MG PO CAPS
25.0000 mg | ORAL_CAPSULE | Freq: Two times a day (BID) | ORAL | 0 refills | Status: AC | PRN
Start: 2023-03-21 — End: ?

## 2023-03-21 MED ORDER — VALSARTAN 40 MG PO TABS
40.0000 mg | ORAL_TABLET | Freq: Every day | ORAL | 3 refills | Status: AC
Start: 2023-03-21 — End: ?

## 2023-03-21 MED ORDER — TETANUS-DIPHTH-ACELL PERTUSSIS 5-2-15.5 LF-MCG/0.5 IM SUSP
0.5000 mL | Freq: Once | INTRAMUSCULAR | 0 refills | Status: AC
Start: 2023-03-21 — End: 2023-03-22
  Filled 2023-03-21: qty 0.5, 1d supply, fill #0

## 2023-03-21 MED ORDER — TRIAMCINOLONE ACETONIDE 0.1 % EX CREA
1.0000 | TOPICAL_CREAM | Freq: Two times a day (BID) | CUTANEOUS | 1 refills | Status: AC
Start: 2023-03-21 — End: ?

## 2023-03-21 NOTE — Progress Notes (Signed)
Patient ID: Thomas Whitehead, male    DOB: 01-Jun-1976  MRN: [id number]  CC: Hypertension (HTN f/u. Nicki Reaper stopping Atorvastatin due to feeling like "a train ran over me" - requesting a different medication. /Continued dermatitis, itching X1 yr/Yes to Tdap vax. )   Subjective: Thomas Whitehead is a 47 y.o. male who presents for chronic disease management His concerns today include:  Pt with hx of HTN, HL, tob dep/vapes, EtOH use disorder,   HM: Due for Tdap which he is agreeable to receiving today.  Dealing with a lot of life stresses.  92 yr old dog recently dx with lymphoma, problems getting mail and has transportation issues.   HTN: On Norvasc 10 mg daily; confirms taking daily.  Checks BP about 2x/wk.  BP remains elev in 130s/80s Still vaping nicotine products Still drinking about 6 pk/day some days less.    HL/Obesity: On last visit he was advised to take the atorvastatin consistently.  Patient states that it made him feel like he was ran over by a train when he wakes up in the mornings so he d/c taking.  Last lipid profile is shown below. Eating less junk and greasy foods.  Less fast foods, more veggies; cut back on red meat. Does a lot of moving at work.  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  Would like to get back to gym but has not had the time  C/o recurrent Rash on arms, legs  and trunk.  Went away for a yr then came back last fall.  Was given Triamcinolone cream, Prednisone  and Vistaril 01/30/2022 when seen by PA.  Went away then came back last fall.  Itches some.  Has insurance [employer] through work but does not have card as yet. Would like derm referral. Patient Active Problem List   Diagnosis Date Noted   Vapes nicotine containing substance 08/22/2022   Alcohol use disorder 08/22/2022   Dermatitis 01/30/2022   Elevated blood pressure reading in office without diagnosis of hypertension 01/30/2022     Current Outpatient Medications on File  Prior to Visit  Medication Sig Dispense Refill   amLODipine (NORVASC) 10 MG tablet Take 1 tablet (10 mg total) by mouth daily. 30 tablet 6   atorvastatin (LIPITOR) 10 MG tablet Take 1 tablet (10 mg total) by mouth daily. (Patient not taking: Reported on 05/21/2022) 90 tablet 1   No current facility-administered medications on file prior to visit.    Not on File  Social History   Socioeconomic History   Marital status: Single    Spouse name: Not on file   Number of children: Not on file   Years of education: Not on file   Highest education level: Some college, no degree  Occupational History   Not on file  Tobacco Use   Smoking status: Every Day    Types: E-cigarettes   Smokeless tobacco: Not on file  Substance and Sexual Activity   Alcohol use: Not Currently   Drug use: Never   Sexual activity: Not on file  Other Topics Concern   Not on file  Social History Narrative   Not on file   Social Determinants of Health   Financial Resource Strain: Low Risk  (03/18/2023)   Overall Financial Resource Strain (CARDIA)    Difficulty of Paying Living Expenses: Not very hard  Food Insecurity: No Food Insecurity (03/18/2023)   Hunger Vital Sign    Worried About Running Out of Food  in the Last Year: Never true    Ran Out of Food in the Last Year: Never true  Transportation Needs: No Transportation Needs (03/18/2023)   PRAPARE - Administrator, Civil Service (Medical): No    Lack of Transportation (Non-Medical): No  Physical Activity: Insufficiently Active (03/18/2023)   Exercise Vital Sign    Days of Exercise per Week: 2 days    Minutes of Exercise per Session: 60 min  Stress: No Stress Concern Present (03/18/2023)   [employer] of Occupational Health - Occupational Stress Questionnaire    Feeling of Stress : Only a little  Social Connections: Moderately Isolated (03/18/2023)   Social Connection and Isolation Panel [NHANES]    Frequency of Communication with Friends  and Family: More than three times a week    Frequency of Social Gatherings with Friends and Family: Once a week    Attends Religious Services: Never    Database administrator or Organizations: Yes    Attends Banker Meetings: Never    Marital Status: Never married  Intimate Partner Violence: Not on file    No family history on file.  No past surgical history on file.  ROS: Review of Systems Negative except as stated above  PHYSICAL EXAM: BP (!) 143/91 (BP Location: Left Arm, Patient Position: Sitting, Cuff Size: Normal)   Pulse 100   Temp 98.5 F (36.9 C) (Oral)   Ht 5\' 9"  (1.753 m)   Wt 200 lb (90.7 kg)   SpO2 99%   BMI 29.53 kg/m   BP 150/95 Wt Readings from Last 3 Encounters:  03/21/23 200 lb (90.7 kg)  08/22/22 195 lb 9.6 oz (88.7 kg)  05/21/22 193 lb (87.5 kg)    Physical Exam   General appearance - alert, well appearing, and in no distress Neck - supple, no significant adenopathy Chest - clear to auscultation, no wheezes, rales or rhonchi, symmetric air entry Heart - normal rate, regular rhythm, normal S1, S2, no murmurs, rubs, clicks or gallops Extremities - peripheral pulses normal, no pedal edema, no clubbing or cyanosis Skin -scattered Salmon colored plaques noted over the anterior and posterior trunk, arms and legs.        Latest Ref Rng & Units 02/15/2022    8:36 AM 01/22/2008    4:36 AM  CMP  Glucose 70 - 99 mg/dL 98  82   BUN 6 - 24 mg/dL 14  7   Creatinine 9.60 - 1.27 mg/dL 4.54  1.0   Sodium 098 - 144 mmol/L 136  139   Potassium 3.5 - 5.2 mmol/L 4.0  3.8   Chloride 96 - 106 mmol/L 98  106   Calcium 8.7 - 10.2 mg/dL 9.7    Total Protein 6.0 - 8.5 g/dL 7.5    Total Bilirubin 0.0 - 1.2 mg/dL 0.2    Alkaline Phos 44 - 121 IU/L 70    AST 0 - 40 IU/L 24     Lipid Panel     Component Value Date/Time   CHOL 242 (H) 02/15/2022 0836   TRIG 341 (H) 02/15/2022 0836   HDL 51 02/15/2022 0836   CHOLHDL 4.7 02/15/2022 0836   LDLCALC  130 (H) 02/15/2022 0836    CBC    Component Value Date/Time   WBC 6.1 02/15/2022 0836   WBC 7.6 01/22/2008 0430   RBC 4.34 02/15/2022 0836   RBC 4.49 01/22/2008 0430   HGB 14.3 02/15/2022 0836   HCT 42.2 02/15/2022 0836  PLT 267 02/15/2022 0836   MCV 97 02/15/2022 0836   MCH 32.9 02/15/2022 0836   MCHC 33.9 02/15/2022 0836   MCHC 35.0 01/22/2008 0430   RDW 12.3 02/15/2022 0836   LYMPHSABS 1.3 02/15/2022 0836   MONOABS 0.5 01/22/2008 0430   EOSABS 0.2 02/15/2022 0836   BASOSABS 0.0 02/15/2022 0836    ASSESSMENT AND PLAN: 1. Essential hypertension Not at goal.  Continue amlodipine 10 mg daily.  Discussed adding another blood pressure medication to get him at goal.  He would prefer not to be placed on a diuretic because it would make him urinate too much at work so we went with valsartan. - CBC - Comprehensive metabolic panel - amLODipine (NORVASC) 10 MG tablet; Take 1 tablet (10 mg total) by mouth daily.  Dispense: 30 tablet; Refill: 6 - valsartan (DIOVAN) 40 MG tablet; Take 1 tablet (40 mg total) by mouth daily.  Dispense: 30 tablet; Refill: 3  2. Mixed hyperlipidemia Did not tolerate atorvastatin due to muscle aches.  We will recheck lipid profile today.  If cholesterol is elevated and ASCVD score is elevated, he is willing to try a different statin at which time we can start him low taking it 3 days a week. - Lipid panel  3. Alcohol use disorder Encouraged him to try to cut back to no more than 212 ounce beers a day.  4. Dermatitis - Ambulatory referral to Dermatology - predniSONE (DELTASONE) 20 MG tablet; Take 1 tablet (20 mg total) by mouth daily with breakfast.  Dispense: 7 tablet; Refill: 0 - triamcinolone cream (KENALOG) 0.1 %; Apply 1 Application topically 2 (two) times daily.  Dispense: 45 g; Refill: 1 - hydrOXYzine (VISTARIL) 25 MG capsule; Take 1 capsule (25 mg total) by mouth 2 (two) times daily as needed (Medication can cause drowsiness).  Dispense: 30 capsule;  Refill: 0  5. Overweight (BMI 25.0-29.9) Commended him on changing his eating habits.  Encouraged him to keep up the good work.  6. Vapes nicotine containing substance Strongly encouraged him to quit vaping as this is not without risks.  7. Need for Tdap vaccination - Tdap (ADACEL) 03-26-14.5 LF-MCG/0.5 injection; Inject 0.5 mLs into the muscle once for 1 dose.  Dispense: 0.5 mL; Refill: 0     Patient was given the opportunity to ask questions.  Patient verbalized understanding of the plan and was able to repeat key elements of the plan.   This documentation was completed using Paediatric nurse.  Any transcriptional errors are unintentional.  No orders of the defined types were placed in this encounter.    Requested Prescriptions    No prescriptions requested or ordered in this encounter    No follow-ups on file.  Jonah Blue, MD, FACP

## 2023-03-22 LAB — LIPID PANEL
Chol/HDL Ratio: 3.3 ratio (ref 0.0–5.0)
Cholesterol, Total: 221 mg/dL — ABNORMAL HIGH (ref 100–199)
HDL: 68 mg/dL (ref >39)
LDL Chol Calc (NIH): 126 mg/dL — ABNORMAL HIGH (ref 0–99)
Triglycerides: 152 mg/dL — ABNORMAL HIGH (ref 0–149)
VLDL Cholesterol Cal: 27 mg/dL (ref 5–40)

## 2023-03-22 LAB — COMPREHENSIVE METABOLIC PANEL
ALT: 16 IU/L (ref 0–44)
AST: 26 IU/L (ref 0–40)
Albumin/Globulin Ratio: 1.5 (ref 1.2–2.2)
Albumin: 4.5 g/dL (ref 4.1–5.1)
Alkaline Phosphatase: 74 IU/L (ref 44–121)
BUN/Creatinine Ratio: 17 (ref 9–20)
BUN: 13 mg/dL (ref 6–24)
Bilirubin Total: 0.4 mg/dL (ref 0.0–1.2)
CO2: 24 mmol/L (ref 20–29)
Calcium: 9.9 mg/dL (ref 8.7–10.2)
Chloride: 102 mmol/L (ref 96–106)
Creatinine, Ser: 0.78 mg/dL (ref 0.76–1.27)
Globulin, Total: 3 g/dL (ref 1.5–4.5)
Glucose: 61 mg/dL — ABNORMAL LOW (ref 70–99)
Potassium: 4.2 mmol/L (ref 3.5–5.2)
Sodium: 142 mmol/L (ref 134–144)
Total Protein: 7.5 g/dL (ref 6.0–8.5)
eGFR: 111 mL/min/{1.73_m2} (ref >59)

## 2023-03-22 LAB — CBC
Hematocrit: 38.6 % (ref 37.5–51.0)
Hemoglobin: 13.7 g/dL (ref 13.0–17.7)
MCH: 33 pg (ref 26.6–33.0)
MCHC: 35.5 g/dL (ref 31.5–35.7)
MCV: 93 fL (ref 79–97)
Platelets: 284 10*3/uL (ref 150–450)
RBC: 4.15 x10E6/uL (ref 4.14–5.80)
RDW: 12.3 % (ref 11.6–15.4)
WBC: 7.7 10*3/uL (ref 3.4–10.8)

## 2023-04-25 ENCOUNTER — Ambulatory Visit: Payer: BC Managed Care – PPO | Admitting: Pharmacist

## 2023-04-28 DIAGNOSIS — S29012A Strain of muscle and tendon of back wall of thorax, initial encounter: Secondary | ICD-10-CM | POA: Diagnosis not present

## 2023-05-02 ENCOUNTER — Ambulatory Visit: Payer: BC Managed Care – PPO | Admitting: Pharmacist

## 2023-08-01 ENCOUNTER — Ambulatory Visit: Payer: BC Managed Care – PPO | Admitting: Internal Medicine

## 2023-10-03 ENCOUNTER — Ambulatory Visit: Payer: BC Managed Care – PPO | Admitting: Internal Medicine

## 2023-12-11 ENCOUNTER — Ambulatory Visit: Payer: BC Managed Care – PPO | Admitting: Internal Medicine
